# Patient Record
Sex: Male | Born: 1995 | Race: Black or African American | Hispanic: No | Marital: Single | Smoking: Never smoker
Health system: Southern US, Community
[De-identification: ages and names within clinical notes are randomized; demographics above are authoritative.]

---
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
## 2014-12-06 ENCOUNTER — Emergency Department (HOSPITAL_COMMUNITY): Payer: Medicaid Other

## 2014-12-06 ENCOUNTER — Emergency Department (HOSPITAL_COMMUNITY)
Admission: EM | Admit: 2014-12-06 | Discharge: 2014-12-07 | Disposition: A | Discharge status: Home / Self Care | Payer: Medicaid Other | Attending: Emergency Medicine | Admitting: Emergency Medicine

## 2014-12-06 ENCOUNTER — Encounter (HOSPITAL_COMMUNITY): Payer: Self-pay | Admitting: Emergency Medicine

## 2014-12-06 DIAGNOSIS — Y998 Other external cause status: Secondary | ICD-10-CM | POA: Insufficient documentation

## 2014-12-06 DIAGNOSIS — Z79899 Other long term (current) drug therapy: Secondary | ICD-10-CM | POA: Insufficient documentation

## 2014-12-06 DIAGNOSIS — S99911A Unspecified injury of right ankle, initial encounter: Secondary | ICD-10-CM | POA: Insufficient documentation

## 2014-12-06 DIAGNOSIS — Y9367 Activity, basketball: Secondary | ICD-10-CM | POA: Insufficient documentation

## 2014-12-06 DIAGNOSIS — Z792 Long term (current) use of antibiotics: Secondary | ICD-10-CM | POA: Insufficient documentation

## 2014-12-06 DIAGNOSIS — W231XXA Caught, crushed, jammed, or pinched between stationary objects, initial encounter: Secondary | ICD-10-CM | POA: Insufficient documentation

## 2014-12-06 DIAGNOSIS — R52 Pain, unspecified: Secondary | ICD-10-CM

## 2014-12-06 DIAGNOSIS — J45909 Unspecified asthma, uncomplicated: Secondary | ICD-10-CM | POA: Insufficient documentation

## 2014-12-06 DIAGNOSIS — M25571 Pain in right ankle and joints of right foot: Secondary | ICD-10-CM

## 2014-12-06 DIAGNOSIS — Y9289 Other specified places as the place of occurrence of the external cause: Secondary | ICD-10-CM | POA: Insufficient documentation

## 2014-12-06 DIAGNOSIS — T1490XA Injury, unspecified, initial encounter: Secondary | ICD-10-CM

## 2014-12-06 NOTE — ED Notes (Signed)
Pt. tripped and twisted his right ankle this evening, presents with pain and swelling at right ankle.

## 2014-12-06 NOTE — ED Provider Notes (Signed)
CSN: [id number]     Arrival date & time 12/06/14  2242 History  This chart was scribed for non-physician practitioner working, Trixie DredgeEmily Jaydis Duchene, PA-C, with Doug SouSam Jacubowitz, MD, by Bronson CurbJacqueline Melvin, ED Scribe. This patient was seen in room TR05C/TR05C and the patient's care was started at 11:54 PM.   Chief Complaint  Patient presents with  . Ankle Pain    The history is provided by the patient. No language interpreter was used.     HPI Comments: Aaron Dougherty is a 18 y.o. male who presents to the Emergency Department complaining of sudden onset, constant, right ankle pain that started PTA. Patient states he was playing basketball when his ankle got caught in the steps of the bleachers. There is associated swelling. He denies any other injuries, fall, head impact, LOC, numbness/weakness.   Past Medical History  Diagnosis Date  . Asthma    History reviewed. No pertinent past surgical history. No family history on file. History  Substance Use Topics  . Smoking status: Never Smoker   . Smokeless tobacco: Not on file  . Alcohol Use: No    Review of Systems  Constitutional: Negative for fever.  Cardiovascular: Negative for chest pain.  Gastrointestinal: Negative for abdominal pain.  Musculoskeletal: Positive for joint swelling (left ankle), arthralgias (left ankle) and gait problem.  Skin: Negative for color change and wound.  Allergic/Immunologic: Negative for immunocompromised state.  Neurological: Negative for weakness and numbness.  Hematological: Does not bruise/bleed easily.      Allergies  Review of patient's allergies indicates no known allergies.  Home Medications   Prior to Admission medications   Medication Sig Start Date End Date Taking? Authorizing Provider  albuterol (PROVENTIL HFA;VENTOLIN HFA) 108 (90 BASE) MCG/ACT inhaler Inhale 2 puffs into the lungs every 6 (six) hours as needed. Wheezing or shortness of breath    Historical Provider, MD  clindamycin (CLEOCIN) 300  MG capsule Take 2 capsules (600 mg total) by mouth 3 (three) times daily. 09/02/12   Twana FirstBryan R Hess, DO  ibuprofen (ADVIL,MOTRIN) 600 MG tablet Take 600 mg by mouth every 6 (six) hours as needed.    Historical Provider, MD   Triage Vitals: BP 132/58 mmHg  Pulse 95  Temp(Src) 98 F (36.7 C) (Oral)  Resp 18  Ht 6\' 1"  (1.854 m)  Wt 185 lb (83.915 kg)  BMI 24.41 kg/m2  SpO2 100%  Physical Exam  Constitutional: He appears well-developed and well-nourished. No distress.  HENT:  Head: Normocephalic and atraumatic.  Neck: Neck supple.  Pulmonary/Chest: Effort normal.  Musculoskeletal:       Right ankle: He exhibits swelling. Tenderness.       Right lower leg: Normal.       Right foot: Normal. There is no bony tenderness, normal capillary refill, no deformity and no laceration.       Feet:  Right foot: Dorsalis pedis pulses intact, capillary refill < 2 seconds, sensation intact.   Neurological: He is alert.  Skin: He is not diaphoretic.  Nursing note and vitals reviewed.   ED Course  Procedures (including critical care time)  DIAGNOSTIC STUDIES: Oxygen Saturation is 100% on room air, normal by my interpretation.    COORDINATION OF CARE: At 2356 Discussed treatment plan with patient. Patient agrees.   Labs Review Labs Reviewed - No data to display  Imaging Review Dg Ankle Complete Right  12/06/2014   CLINICAL DATA:  18 year old male with history of trauma earlier today while playing basketball with a rolling  injury to the right ankle complaining of pain and swelling.  EXAM: RIGHT ANKLE - COMPLETE 3+ VIEW  COMPARISON:  02/11/2011.  FINDINGS: Soft tissue swelling overlying the lateral malleolus, and anterior to the tibiotalar joint. No acute displaced fracture, subluxation or dislocation.  IMPRESSION: 1. Negative for acute bony trauma. 2. Extensive soft tissue swelling of the lateral malleolus and anterior to the tibiotalar joint.   Electronically Signed   By: Trudie Reedaniel  Entrikin M.D.    On: 12/06/2014 23:30     EKG Interpretation None      MDM   Final diagnoses:  Right ankle pain    Afebrile, nontoxic patient with right ankle pain and swelling.  Likely contusion and possible sprain.  Xray negative.   D/C home with aso, crutches, ibuprofen, advised RICE treatment.  Discussed result, findings, treatment, and follow up  with patient.  Pt given return precautions.  Pt verbalizes understanding and agrees with plan.       I personally performed the services described in this documentation, which was scribed in my presence. The recorded information has been reviewed and is accurate.    Trixie Dredgemily Belvia Gotschall, PA-C 12/07/14 0009  Doug SouSam Jacubowitz, MD 12/07/14 [id number]

## 2014-12-06 NOTE — ED Notes (Signed)
Pt present to ED for eval of swelling noted to lateral aspect of left ankle, states got ankle caught in a bleacher step. NAD, pulses present, limited ROM noted due to pain. Denies any numbness.

## 2014-12-06 NOTE — ED Notes (Signed)
Ice applied to ankle

## 2014-12-07 MED ORDER — IBUPROFEN 800 MG PO TABS: 800.0000 mg | ORAL_TABLET | Freq: Three times a day (TID) | ORAL | Status: AC | PRN

## 2014-12-07 NOTE — Discharge Instructions (Signed)
Read the information below.  Use the prescribed medication as directed.  Please discuss all new medications with your pharmacist.  You may return to the Emergency Department at any time for worsening condition or any new symptoms that concern you.  If you develop uncontrolled pain, weakness or numbness of the extremity, severe discoloration of the skin, or you are unable to walk, return to the ER for a recheck.    ° ° °Ankle Pain °Ankle pain is a common symptom. The bones, cartilage, tendons, and muscles of the ankle joint perform a lot of work each day. The ankle joint holds your body weight and allows you to move around. Ankle pain can occur on either side or back of 1 or both ankles. Ankle pain may be sharp and burning or dull and aching. There may be tenderness, stiffness, redness, or warmth around the ankle. The pain occurs more often when a person walks or puts pressure on the ankle. °CAUSES  °There are many reasons ankle pain can develop. It is important to work with your caregiver to identify the cause since many conditions can impact the bones, cartilage, muscles, and tendons. Causes for ankle pain include: °· Injury, including a break (fracture), sprain, or strain often due to a fall, sports, or a high-impact activity. °· Swelling (inflammation) of a tendon (tendonitis). °· Achilles tendon rupture. °· Ankle instability after repeated sprains and strains. °· Poor foot alignment. °· Pressure on a nerve (tarsal tunnel syndrome). °· Arthritis in the ankle or the lining of the ankle. °· Crystal formation in the ankle (gout or pseudogout). °DIAGNOSIS  °A diagnosis is based on your medical history, your symptoms, results of your physical exam, and results of diagnostic tests. Diagnostic tests may include X-ray exams or a computerized magnetic scan (magnetic resonance imaging, MRI). °TREATMENT  °Treatment will depend on the cause of your ankle pain and may include: °· Keeping pressure off the ankle and limiting  activities. °· Using crutches or other walking support (a cane or brace). °· Using rest, ice, compression, and elevation. °· Participating in physical therapy or home exercises. °· Wearing shoe inserts or special shoes. °· Losing weight. °· Taking medications to reduce pain or swelling or receiving an injection. °· Undergoing surgery. °HOME CARE INSTRUCTIONS  °· Only take over-the-counter or prescription medicines for pain, discomfort, or fever as directed by your caregiver. °· Put ice on the injured area. °¨ Put ice in a plastic bag. °¨ Place a towel between your skin and the bag. °¨ Leave the ice on for 15-20 minutes at a time, 03-04 times a day. °· Keep your leg raised (elevated) when possible to lessen swelling. °· Avoid activities that cause ankle pain. °· Follow specific exercises as directed by your caregiver. °· Record how often you have ankle pain, the location of the pain, and what it feels like. This information may be helpful to you and your caregiver. °· Ask your caregiver about returning to work or sports and whether you should drive. °· Follow up with your caregiver for further examination, therapy, or testing as directed. °SEEK MEDICAL CARE IF:  °· Pain or swelling continues or worsens beyond 1 week. °· You have an oral temperature above 102° F (38.9° C). °· You are feeling unwell or have chills. °· You are having an increasingly difficult time with walking. °· You have loss of sensation or other new symptoms. °· You have questions or concerns. °MAKE SURE YOU:  °· Understand these instructions. °· Will   watch your condition. °· Will get help right away if you are not doing well or get worse. °Document Released: 05/15/2010 Document Revised: 02/17/2012 Document Reviewed: 05/15/2010 °[employer]® Patient Information ©2015 [employer]. This information is not intended to replace advice given to you by your health care provider. Make sure you discuss any questions you have with your health care  provider. ° °

## 2015-03-07 LAB — HM HIV SCREENING LAB: HM HIV Screening: NEGATIVE

## 2015-06-04 ENCOUNTER — Encounter (HOSPITAL_COMMUNITY): Payer: Self-pay | Admitting: *Deleted

## 2015-06-04 ENCOUNTER — Emergency Department (HOSPITAL_COMMUNITY)
Admission: EM | Admit: 2015-06-04 | Discharge: 2015-06-04 | Disposition: A | Discharge status: Home / Self Care | Payer: Medicaid Other | Attending: Emergency Medicine | Admitting: Emergency Medicine

## 2015-06-04 ENCOUNTER — Emergency Department (HOSPITAL_COMMUNITY): Payer: Medicaid Other

## 2015-06-04 DIAGNOSIS — Z792 Long term (current) use of antibiotics: Secondary | ICD-10-CM | POA: Insufficient documentation

## 2015-06-04 DIAGNOSIS — S62609A Fracture of unspecified phalanx of unspecified finger, initial encounter for closed fracture: Secondary | ICD-10-CM

## 2015-06-04 DIAGNOSIS — S61214A Laceration without foreign body of right ring finger without damage to nail, initial encounter: Secondary | ICD-10-CM | POA: Insufficient documentation

## 2015-06-04 DIAGNOSIS — S62664A Nondisplaced fracture of distal phalanx of right ring finger, initial encounter for closed fracture: Secondary | ICD-10-CM | POA: Insufficient documentation

## 2015-06-04 DIAGNOSIS — Z79899 Other long term (current) drug therapy: Secondary | ICD-10-CM | POA: Insufficient documentation

## 2015-06-04 DIAGNOSIS — Y9389 Activity, other specified: Secondary | ICD-10-CM | POA: Insufficient documentation

## 2015-06-04 DIAGNOSIS — S61219A Laceration without foreign body of unspecified finger without damage to nail, initial encounter: Secondary | ICD-10-CM

## 2015-06-04 DIAGNOSIS — Y9289 Other specified places as the place of occurrence of the external cause: Secondary | ICD-10-CM | POA: Insufficient documentation

## 2015-06-04 DIAGNOSIS — W208XXA Other cause of strike by thrown, projected or falling object, initial encounter: Secondary | ICD-10-CM | POA: Insufficient documentation

## 2015-06-04 DIAGNOSIS — J45909 Unspecified asthma, uncomplicated: Secondary | ICD-10-CM | POA: Insufficient documentation

## 2015-06-04 DIAGNOSIS — Y998 Other external cause status: Secondary | ICD-10-CM | POA: Insufficient documentation

## 2015-06-04 MED ORDER — CEPHALEXIN 500 MG PO CAPS: 500.0000 mg | ORAL_CAPSULE | Freq: Four times a day (QID) | ORAL | Status: DC

## 2015-06-04 MED ORDER — LIDOCAINE HCL (PF) 1 % IJ SOLN
INTRAMUSCULAR | Status: AC
  Administered 2015-06-04: 5 mL via INTRADERMAL
  Filled 2015-06-04: qty 5

## 2015-06-04 MED ORDER — LIDOCAINE HCL (PF) 1 % IJ SOLN
5.0000 mL | Freq: Once | INTRAMUSCULAR | Status: AC
  Administered 2015-06-04: 5 mL via INTRADERMAL
  Filled 2015-06-04: qty 5

## 2015-06-04 NOTE — Discharge Instructions (Signed)
Finger Fracture A finger fracture is when one or more bones in the finger break.  HOME CARE   Wear the splint, tape, or cast as long as told by your doctor.  Keep your fingers in the position your doctor tell you to.  Raise (elevate) the injured area above the level of the heart.  Only take medicine as told by your doctor.  Put ice on the injured area.  Put ice in a plastic bag.  Place a towel between the skin and the bag.  Leave the ice on for 15-20 minutes, 03-04 times a day.  Follow up with your doctor.  Ask what exercises you can do when the splint comes off. GET HELP RIGHT AWAY IF:   The fingernails are white or bluish.  You have pain not helped by medicine.  You cannot move your fingertips.  You lose feeling (numbness) in the injured finger(s). MAKE SURE YOU:   Understand these instructions.  Will watch this condition.  Will get help right away if you are not doing well or get worse. Document Released: 05/13/2008 Document Revised: 02/17/2012 Document Reviewed: 05/13/2008 [facility] Patient Information 2015 [place], [place]. This information is not intended to replace advice given to you by your health care provider. Make sure you discuss any questions you have with your health care provider.  Laceration Care, Adult A laceration is a cut that goes through all layers of the skin. The cut goes into the tissue beneath the skin. HOME CARE For stitches (sutures) or staples:  Keep the cut clean and dry.  If you have a bandage (dressing), change it at least once a day. Change the bandage if it gets wet or dirty, or as told by your doctor.  Wash the cut with soap and water 2 times a day. Rinse the cut with water. Pat it dry with a clean towel.  Put a thin layer of medicated cream on the cut as told by your doctor.  You may shower after the first 24 hours. Do not soak the cut in water until the stitches are removed.  Only take medicines as told by your  doctor.  Have your stitches or staples removed as told by your doctor. For skin adhesive strips:  Keep the cut clean and dry.  Do not get the strips wet. You may take a bath, but be careful to keep the cut dry.  If the cut gets wet, pat it dry with a clean towel.  The strips will fall off on their own. Do not remove the strips that are still stuck to the cut. For wound glue:  You may shower or take baths. Do not soak or scrub the cut. Do not swim. Avoid heavy sweating until the glue falls off on its own. After a shower or bath, pat the cut dry with a clean towel.  Do not put medicine on your cut until the glue falls off.  If you have a bandage, do not put tape over the glue.  Avoid lots of sunlight or tanning lamps until the glue falls off. Put sunscreen on the cut for the first year to reduce your scar.  The glue will fall off on its own. Do not pick at the glue. You may need a tetanus shot if:  You cannot remember when you had your last tetanus shot.  You have never had a tetanus shot. If you need a tetanus shot and you choose not to have one, you may get tetanus. Sickness  from tetanus can be serious. GET HELP RIGHT AWAY IF:   Your pain does not get better with medicine.  Your arm, hand, leg, or foot loses feeling (numbness) or changes color.  Your cut is bleeding.  Your joint feels weak, or you cannot use your joint.  You have painful lumps on your body.  Your cut is red, puffy (swollen), or painful.  You have a red line on the skin near the cut.  You have yellowish-white fluid (pus) coming from the cut.  You have a fever.  You have a bad smell coming from the cut or bandage.  Your cut breaks open before or after stitches are removed.  You notice something coming out of the cut, such as wood or glass.  You cannot move a finger or toe. MAKE SURE YOU:   Understand these instructions.  Will watch your condition.  Will get help right away if you are not  doing well or get worse. Document Released: 05/13/2008 Document Revised: 02/17/2012 Document Reviewed: 05/21/2011 [facility] Patient Information 2015 [place], [place]. This information is not intended to replace advice given to you by your health care provider. Make sure you discuss any questions you have with your health care provider.

## 2015-06-04 NOTE — ED Notes (Signed)
Pt states that he was working with his father today when he dropped a tool on his right 4th finger, laceration noted to knuckle area of finger, bleeding controlled,

## 2015-06-06 NOTE — ED Provider Notes (Signed)
CSN: [id number]     Arrival date & time 06/04/15  2038 History   First MD Initiated Contact with Patient 06/04/15 2053     Chief Complaint  Patient presents with  . Finger Injury     (Consider location/radiation/quality/duration/timing/severity/associated sxs/prior Treatment) HPI  Aaron Dougherty is a 19 y.o. male who presents to the Emergency Department complaining of pain and laceration to his right ring finger.  Reports a direct blow to the finger from a metal tool that was accidentally dropped.  Pain located at the PIP of the finger.  He reports mild bleeding that resolved after applying pressure.  He denies numbness, swelling or injuries.  Last TD is < 5 years ago.  Pain is worse with bending the finger.      Past Medical History  Diagnosis Date  . Asthma    History reviewed. No pertinent past surgical history. No family history on file. History  Substance Use Topics  . Smoking status: Never Smoker   . Smokeless tobacco: Not on file  . Alcohol Use: No    Review of Systems  Constitutional: Negative for fever and chills.  Musculoskeletal: Positive for arthralgias. Negative for joint swelling.       Pain to right ring finger  Skin: Positive for wound. Negative for color change.  All other systems reviewed and are negative.     Allergies  Review of patient's allergies indicates no known allergies.  Home Medications   Prior to Admission medications   Medication Sig Start Date End Date Taking? Authorizing Provider  albuterol (PROVENTIL HFA;VENTOLIN HFA) 108 (90 BASE) MCG/ACT inhaler Inhale 2 puffs into the lungs every 6 (six) hours as needed. Wheezing or shortness of breath    Historical Provider, MD  cephALEXin (KEFLEX) 500 MG capsule Take 1 capsule (500 mg total) by mouth 4 (four) times daily. For 7 days 06/04/15   Raenah Murley, PA-C  clindamycin (CLEOCIN) 300 MG capsule Take 2 capsules (600 mg total) by mouth 3 (three) times daily. 09/02/12   [name], DO  ibuprofen  (ADVIL,MOTRIN) 800 MG tablet Take 1 tablet (800 mg total) by mouth every 8 (eight) hours as needed for mild pain or moderate pain. 12/07/14   Trixie Dredge, PA-C   BP 125/63 mmHg  Pulse 87  Temp(Src) 97.3 F (36.3 C) (Oral)  Resp 24  Ht  (1.854 m)  Wt 185 lb (83.915 kg)  BMI 24.41 kg/m2  SpO2 99% Physical Exam  Constitutional: He appears well-developed and well-nourished. No distress.  HENT:  Head: Normocephalic and atraumatic.  Cardiovascular: Normal rate, regular rhythm and intact distal pulses.   Pulmonary/Chest: Effort normal. No respiratory distress.  Musculoskeletal: Normal range of motion. He exhibits tenderness. He exhibits no edema.       Right hand: He exhibits laceration. He exhibits normal capillary refill, no deformity and no swelling. Normal strength noted. He exhibits no finger abduction.       Hands: Neurological: He is alert.  Skin: Skin is warm and dry.  Laceration to dorsal aspect of the right third finger at the PIP joint.  Bleeding controlled.  No edema.  No obvious injury to deep structures of the finger.   Nursing note and vitals reviewed.   ED Course  Procedures (including critical care time) Labs Review Labs Reviewed - No data to display  Imaging Review Dg Finger Ring Right  06/04/2015   CLINICAL DATA:  Pain, laceration fourth finger on the right, dropped a tool on the  finger today with laceration to knuckle  EXAM: RIGHT RING FINGER 2+V  COMPARISON:  None.  FINDINGS: Oblique nondisplaced fracture through the radial side base of the distal phalanx extending into the distal interphalangeal joint.  IMPRESSION: Fracture   Electronically Signed   By: Esperanza Heiraymond  Rubner M.D.   On: 06/04/2015 21:37     EKG Interpretation None       LACERATION REPAIR Performed by: Marquette Piontek L. Authorized by: Maxwell CaulRIPLETT,Aunya Lemler L. Consent: Verbal consent obtained. Risks and benefits: risks, benefits and alternatives were discussed Consent given by: patient Patient  identity confirmed: provided demographic data Prepped and Draped in normal sterile fashion Wound explored  Laceration Location: dorsal right ring finger  Laceration Length: 2 cm  No Foreign Bodies seen or palpated  Anesthesia: local infiltration  Local anesthetic: lidocaine 1% w/o epinephrine  Anesthetic total: 2 ml  Irrigation method: syringe Amount of cleaning: standard  Skin closure: 4-0 prolene  Number of sutures: 5  Technique: simple interrupted  Patient tolerance: Patient tolerated the procedure well with no immediate complications.  MDM   Final diagnoses:  Finger fracture, closed, initial encounter  Finger laceration, initial encounter    XR reviewed and discussed with pt.  No tenderness or wound at the DIP.  NO fx at the PIP.  Pt has full ROM of the finger.  NV intact.    Finger bandaged and splinted.  He agrees to proper wound care, Keflex and close f/u with ortho.  Sutures out in 10 days.  Advised to return if any signs of infection  Pauline Ausammy Reighlynn Swiney, PA-C 06/06/15 1852  Rolland PorterMark James, MD 06/12/15 2039

## 2015-10-16 ENCOUNTER — Emergency Department (HOSPITAL_COMMUNITY)
Admission: EM | Admit: 2015-10-16 | Discharge: 2015-10-16 | Disposition: A | Discharge status: Home / Self Care | Payer: Medicaid Other | Attending: Emergency Medicine | Admitting: Emergency Medicine

## 2015-10-16 ENCOUNTER — Encounter (HOSPITAL_COMMUNITY): Payer: Self-pay | Admitting: *Deleted

## 2015-10-16 DIAGNOSIS — L02411 Cutaneous abscess of right axilla: Secondary | ICD-10-CM | POA: Insufficient documentation

## 2015-10-16 DIAGNOSIS — J45909 Unspecified asthma, uncomplicated: Secondary | ICD-10-CM | POA: Insufficient documentation

## 2015-10-16 DIAGNOSIS — L02213 Cutaneous abscess of chest wall: Secondary | ICD-10-CM | POA: Insufficient documentation

## 2015-10-16 DIAGNOSIS — L0291 Cutaneous abscess, unspecified: Secondary | ICD-10-CM

## 2015-10-16 DIAGNOSIS — Z792 Long term (current) use of antibiotics: Secondary | ICD-10-CM | POA: Insufficient documentation

## 2015-10-16 MED ORDER — CEPHALEXIN 500 MG PO CAPS: 500.0000 mg | ORAL_CAPSULE | Freq: Four times a day (QID) | ORAL | Status: AC

## 2015-10-16 MED ORDER — CEPHALEXIN 250 MG PO CAPS
500.0000 mg | ORAL_CAPSULE | Freq: Once | ORAL | Status: AC
  Administered 2015-10-16: 500 mg via ORAL
  Filled 2015-10-16: qty 2

## 2015-10-16 MED ORDER — LIDOCAINE-EPINEPHRINE (PF) 2 %-1:200000 IJ SOLN
10.0000 mL | Freq: Once | INTRAMUSCULAR | Status: AC
  Administered 2015-10-16: 10 mL via INTRADERMAL
  Filled 2015-10-16: qty 20

## 2015-10-16 NOTE — Discharge Instructions (Signed)
°  Abscess Take antibiotics as prescribed. Return for fever, increased swelling. An abscess (boil or furuncle) is an infected area on or under the skin. This area is filled with yellowish-white fluid (pus) and other material (debris). HOME CARE   Only take medicines as told by your doctor.  If you were given antibiotic medicine, take it as directed. Finish the medicine even if you start to feel better.  If gauze is used, follow your doctor's directions for changing the gauze.  To avoid spreading the infection:  Keep your abscess covered with a bandage.  Wash your hands well.  Do not share personal care items, towels, or whirlpools with others.  Avoid skin contact with others.  Keep your skin and clothes clean around the abscess.  Keep all doctor visits as told. GET HELP RIGHT AWAY IF:   You have more pain, puffiness (swelling), or redness in the wound site.  You have more fluid or blood coming from the wound site.  You have muscle aches, chills, or you feel sick.  You have a fever. MAKE SURE YOU:   Understand these instructions.  Will watch your condition.  Will get help right away if you are not doing well or get worse.   This information is not intended to replace advice given to you by your health care provider. Make sure you discuss any questions you have with your health care provider.   Document Released: 05/13/2008 Document Revised: 05/26/2012 Document Reviewed: 02/08/2012 Elsevier Interactive Patient Education [employer]2016 [employer]

## 2015-10-16 NOTE — ED Notes (Signed)
Declined W/C at D/C and was escorted to lobby by RN. 

## 2015-10-16 NOTE — ED Notes (Signed)
Pt reports having two abscess that are causing pain, one is under right arm x 3-4 days and one on chest x 1 year.

## 2015-10-16 NOTE — ED Provider Notes (Signed)
CSN: [id number]     Arrival date & time 10/16/15  8657 History  By signing my name below, I, [name], attest that this documentation has been prepared under the direction and in the presence of Catha Gosselin, PA-C Electronically Signed: Charline Bills, ED Scribe 10/17/2015 at 10:37 AM.   Chief Complaint  Patient presents with  . Abscess   The history is provided by the patient. No language interpreter was used.   HPI Comments: Aaron Dougherty is a 19 y.o. male who presents to the Emergency Department complaining of a gradually worsening, tender abscess to the right axillary area, first noticed 3 days ago. Pt reports increased pain with palpation. He also presents with a recurrent abscess to the left chest for the past 3 years. Pt states that his mother popped the abscess to his chest in the past, but it returned. No medications tried PTA. Pt denies fever. He has had a knee abscess lanced in the past without any complications. No known medical allergies.   Past Medical History  Diagnosis Date  . Asthma    History reviewed. No pertinent past surgical history. History reviewed. No pertinent family history. Social History  Substance Use Topics  . Smoking status: Never Smoker   . Smokeless tobacco: None  . Alcohol Use: No    Review of Systems  Constitutional: Negative for fever.  Skin:       +Abscess   Allergies  Review of patient's allergies indicates no known allergies.  Home Medications   Prior to Admission medications   Medication Sig Start Date End Date Taking? Authorizing Provider  albuterol (PROVENTIL HFA;VENTOLIN HFA) 108 (90 BASE) MCG/ACT inhaler Inhale 2 puffs into the lungs every 6 (six) hours as needed. Wheezing or shortness of breath    Historical Provider, MD  cephALEXin (KEFLEX) 500 MG capsule Take 1 capsule (500 mg total) by mouth 4 (four) times daily. 10/16/15   Lynisha Osuch Patel-Mills, PA-C  clindamycin (CLEOCIN) 300 MG capsule Take 2 capsules (600 mg total) by  mouth 3 (three) times daily. 09/02/12   [name], DO  ibuprofen (ADVIL,MOTRIN) 800 MG tablet Take 1 tablet (800 mg total) by mouth every 8 (eight) hours as needed for mild pain or moderate pain. 12/07/14   Trixie Dredge, PA-C   BP 150/83 mmHg  Pulse 69  Temp(Src) 97.6 F (36.4 C) (Oral)  Resp 20  SpO2 100% Physical Exam  Constitutional: He is oriented to person, place, and time. He appears well-developed and well-nourished. No distress.  HENT:  Head: Normocephalic and atraumatic.  Eyes: Conjunctivae and EOM are normal.  Neck: Neck supple. No tracheal deviation present.  Cardiovascular: Normal rate.   Pulmonary/Chest: Effort normal. No respiratory distress.  Musculoskeletal: Normal range of motion.  Neurological: He is alert and oriented to person, place, and time.  Skin: Skin is warm and dry.  R axillary: Partially fluctuant and indurated, tender area measuring 5 x 4 cm but no active drainage or surrounding erythema L upper chest: indurated abscess without drainage measuring approximately 2 x 2 centimeters without surrounding erythema or drainage.   Psychiatric: He has a normal mood and affect. His behavior is normal.  Nursing note and vitals reviewed.  ED Course  Procedures (including critical care time) DIAGNOSTIC STUDIES: Oxygen Saturation is 100% on RA, normal by my interpretation.    COORDINATION OF CARE: 9:34 AM-Discussed treatment plan which includes I&D with pt at bedside and pt agreed to plan.   INCISION AND DRAINAGE PROCEDURE NOTE: Patient  identification was confirmed and verbal consent was obtained. This procedure was performed by Catha GosselinHanna Patel-Mills, PA-C at 10:30 AM. Site: R axilla Sterile procedures observed Needle size: 25 gauge Anesthetic used (type and amt): lidocaine with 2% epinephrine Blade size: 11 Drainage: none Complexity: Complex Site anesthetized, incision made over site, wound drained and explored loculations, rinsed with copious amounts of normal  saline, covered with dry, sterile dressing.  Pt tolerated procedure well without complications.  Instructions for care discussed verbally and pt provided with additional written instructions for homecare and f/u. The wound could not be packed due to induration.  Labs Review Labs Reviewed - No data to display  Imaging Review No results found.   EKG Interpretation None      MDM   Final diagnoses:  Abscess  Patient presents with 2 abscesses, one in the right axilla and one on the left side of his chest. The one in the axilla was lanced but there was no drainage. The one on the chest was indurated and did not require lancing. The patient was put on antibiotics. I discussed return precautions with the patient as well as follow-up and he verbally agrees with the plan. Medications  lidocaine-EPINEPHrine (XYLOCAINE W/EPI) 2 %-1:200000 (PF) injection 10 mL (10 mLs Intradermal Given 10/16/15 0947)  cephALEXin (KEFLEX) capsule 500 mg (500 mg Oral Given 10/16/15 1044)   I personally performed the services described in this documentation, which was scribed in my presence. The recorded information has been reviewed and is accurate.    Catha GosselinHanna Patel-Mills, PA-C 10/17/15 [phone]  Laurence Spatesachel Morgan Little, MD 10/18/15 1004

## 2016-02-29 ENCOUNTER — Emergency Department (HOSPITAL_COMMUNITY)
Admission: EM | Admit: 2016-02-29 | Discharge: 2016-02-29 | Disposition: A | Discharge status: Home / Self Care | Payer: Medicaid Other | Attending: Emergency Medicine | Admitting: Emergency Medicine

## 2016-02-29 ENCOUNTER — Encounter (HOSPITAL_COMMUNITY): Payer: Self-pay | Admitting: *Deleted

## 2016-02-29 DIAGNOSIS — N342 Other urethritis: Secondary | ICD-10-CM | POA: Insufficient documentation

## 2016-02-29 DIAGNOSIS — L089 Local infection of the skin and subcutaneous tissue, unspecified: Secondary | ICD-10-CM

## 2016-02-29 DIAGNOSIS — Z792 Long term (current) use of antibiotics: Secondary | ICD-10-CM | POA: Insufficient documentation

## 2016-02-29 DIAGNOSIS — J45909 Unspecified asthma, uncomplicated: Secondary | ICD-10-CM | POA: Insufficient documentation

## 2016-02-29 DIAGNOSIS — L723 Sebaceous cyst: Secondary | ICD-10-CM | POA: Insufficient documentation

## 2016-02-29 DIAGNOSIS — Z79899 Other long term (current) drug therapy: Secondary | ICD-10-CM | POA: Insufficient documentation

## 2016-02-29 MED ORDER — LIDOCAINE-EPINEPHRINE (PF) 2 %-1:200000 IJ SOLN
10.0000 mL | Freq: Once | INTRAMUSCULAR | Status: AC
  Administered 2016-02-29: 10 mL
  Filled 2016-02-29: qty 20

## 2016-02-29 MED ORDER — STERILE WATER FOR INJECTION IJ SOLN
INTRAMUSCULAR | Status: AC
  Filled 2016-02-29: qty 10

## 2016-02-29 MED ORDER — CEFTRIAXONE SODIUM 250 MG IJ SOLR
250.0000 mg | Freq: Once | INTRAMUSCULAR | Status: AC
  Administered 2016-02-29: 250 mg via INTRAMUSCULAR
  Filled 2016-02-29: qty 250

## 2016-02-29 MED ORDER — AZITHROMYCIN 250 MG PO TABS
1000.0000 mg | ORAL_TABLET | Freq: Once | ORAL | Status: AC
  Administered 2016-02-29: 1000 mg via ORAL
  Filled 2016-02-29: qty 4

## 2016-02-29 NOTE — ED Provider Notes (Signed)
CSN: [id number]     Arrival date & time 02/29/16  1140 History  By signing my name below, I, Ronney LionSuzanne Le, attest that this documentation has been prepared under the direction and in the presence of Elpidio AnisShari Haden Cavenaugh, PA-C. Electronically Signed: Ronney LionSuzanne Le, ED Scribe. 02/29/2016. 2:19 PM.    Chief Complaint  Patient presents with  . Wound Check  . Exposure to STD   The history is provided by the patient. No language interpreter was used.    HPI Comments: Aaron Dougherty is a 20 y.o. male with a history of asthma, who presents to the Emergency Department complaining of a recurrent, constant, worsening, localized area of pain and swelling on his upper chest that has been present for several months. Patient states he has had recurrent abscesses in the same area in the past since he was a young child, which he has had I&D'ed in the past.  Patient also complains of constant, unchanged, penile discharge and burning dysuria that began last week. He states his sexual partner was recently tested positive for chlamydia. He denies fever or testicular pain.   Past Medical History  Diagnosis Date  . Asthma    History reviewed. No pertinent past surgical history. No family history on file. Social History  Substance Use Topics  . Smoking status: Never Smoker   . Smokeless tobacco: None  . Alcohol Use: No    Review of Systems  Constitutional: Negative for fever.  Gastrointestinal: Negative for nausea, vomiting and abdominal pain.  Genitourinary: Positive for dysuria and discharge. Negative for scrotal swelling and testicular pain.  Skin:       Positive for localized area of pain and swelling on upper chest.    Allergies  Review of patient's allergies indicates no known allergies.  Home Medications   Prior to Admission medications   Medication Sig Start Date End Date Taking? Authorizing Provider  albuterol (PROVENTIL HFA;VENTOLIN HFA) 108 (90 BASE) MCG/ACT inhaler Inhale 2 puffs into the lungs every  6 (six) hours as needed. Wheezing or shortness of breath    Historical Provider, MD  cephALEXin (KEFLEX) 500 MG capsule Take 1 capsule (500 mg total) by mouth 4 (four) times daily. 10/16/15   Hanna Patel-Mills, PA-C  clindamycin (CLEOCIN) 300 MG capsule Take 2 capsules (600 mg total) by mouth 3 (three) times daily. 09/02/12   Twana FirstBryan R Hess, DO  ibuprofen (ADVIL,MOTRIN) 800 MG tablet Take 1 tablet (800 mg total) by mouth every 8 (eight) hours as needed for mild pain or moderate pain. 12/07/14   Trixie DredgeEmily West, PA-C   BP 132/72 mmHg  Pulse 70  Temp(Src) 97.7 F (36.5 C) (Oral)  Resp 20  Ht 6\' 1"  (1.854 m)  Wt 185 lb (83.915 kg)  BMI 24.41 kg/m2  SpO2 98% Physical Exam  Constitutional: He is oriented to person, place, and time. He appears well-developed and well-nourished. No distress.  HENT:  Head: Normocephalic and atraumatic.  Eyes: Conjunctivae and EOM are normal.  Neck: Neck supple. No tracheal deviation present.  Cardiovascular: Normal rate.   Pulmonary/Chest: Effort normal. No respiratory distress.  Genitourinary:  No visualized penile discharge. Circumcised. No rash. No testicular tenderness or scrotal swelling.   Musculoskeletal: Normal range of motion.  Lymphadenopathy:       Right: No inguinal adenopathy present.       Left: No inguinal adenopathy present.  Neurological: He is alert and oriented to person, place, and time.  Skin: Skin is warm and dry.  Large 3 cm  x 4 cm firm swelling to central upper chest with fluctuant area at upper border. No active drainage. No redness. Mildly tender.  Psychiatric: He has a normal mood and affect. His behavior is normal.  Nursing note and vitals reviewed.   ED Course  Procedures (including critical care time)  DIAGNOSTIC STUDIES: Oxygen Saturation is 98% on RA, normal by my interpretation.    COORDINATION OF CARE: 1:51 PM - Discussed treatment plan with pt at bedside which includes I&D of area on chest. Pt verbalized understanding and  agreed to plan.   INCISION AND DRAINAGE PROCEDURE NOTE: Patient identification was confirmed and verbal consent was obtained. This procedure was performed by Elpidio Anis, PA-C, at 2:01 PM. Site: Upper chest Sterile procedures observed Needle size: 25 Anesthetic used (type and amt): lidocaine 2% w/ epi, 2 mL  Blade size: 11 Drainage: Copious purulent with small amount sebaceous material Complexity: Complex Site anesthetized, incision made over site, wound drained and explored loculations, rinsed with copious amounts of normal saline, covered  with dry, sterile dressing.  Pt tolerated procedure well without complications.   Instructions for care discussed verbally and pt provided with additional written instructions for homecare and f/u.   MDM   Final diagnoses:  None  1. Chest wall abscess 2. Infected sebaceous cyst  Patient with skin abscess amenable to incision and drainage.  Encouraged home warm soaks and flushing. Will d/c to home.  No antibiotic therapy is indicated. He is also treated for STD exposure with Zithromax and rocephin.   I personally performed the services described in this documentation, which was scribed in my presence. The recorded information has been reviewed and is accurate.       Elpidio Anis, PA-C 02/29/16 1457  Pricilla Loveless, MD 03/03/16 [phone]

## 2016-02-29 NOTE — ED Notes (Signed)
Pt reports boil to chest. Pt reports that it has been there for several months. Pt also reports std exposure.

## 2016-02-29 NOTE — Discharge Instructions (Signed)
Sebaceous Cyst Removal Sebaceous cyst removal is a procedure to remove a sac of oily material that forms under your skin (sebaceous cyst). Sebaceous cysts may also be called epidermoid cysts or keratin cysts. Normally, the skin secretes this oily material through a gland or a hair follicle. This type of cyst usually results when a skin gland or hair follicle becomes blocked. You may need this procedure if you have a sebaceous cyst that becomes large, uncomfortable, or infected. LET [facility] CARE PROVIDER KNOW ABOUT:  Any allergies you have.  All medicines you are taking, including vitamins, herbs, eye drops, creams, and over-the-counter medicines.  Previous problems you or members of your family have had with the use of anesthetics.  Any blood disorders you have.  Previous surgeries you have had.  Medical conditions you have. RISKS AND COMPLICATIONS Generally, this is a safe procedure. However, problems may occur, including:  Developing another cyst.  Bleeding.  Infection.  Scarring. BEFORE THE PROCEDURE  Ask your health care provider about:  Changing or stopping your regular medicines. This is especially important if you are taking diabetes medicines or blood thinners.  Taking medicines such as aspirin and ibuprofen. These medicines can thin your blood. Do not take these medicines before your procedure if your health care provider instructs you not to.  If you have an infected cyst, you may have to take antibiotic medicines before or after the cyst removal. Take your antibiotics as directed by your health care provider. Finish all of the medicine even if you start to feel better.  Take a shower on the morning of your procedure. Your health care provider may ask you to use a germ-killing (antiseptic) soap. PROCEDURE  You will be given a medicine that numbs the area (local anesthetic).  The skin around the cyst will be cleaned with a germ-killing solution  (antiseptic).  Your health care provider will make a small surgical incision over the cyst.  The cyst will be separated from the surrounding tissues that are under your skin.  If possible, the cyst will be removed undamaged (intact).  If the cyst bursts (ruptures), it will need to be removed in pieces.  After the cyst is removed, your health care provider will control any bleeding and close the incision with small stitches (sutures). Small incisions may not need sutures, and the bleeding will be controlled by applying direct pressure with gauze.  Your health care provider may apply antibiotic ointment and a light bandage (dressing) over the incision. This procedure may vary among health care providers and hospitals. AFTER THE PROCEDURE  If your cyst ruptured during surgery, you may need to take antibiotic medicine. If you were prescribed an antibiotic medicine, finish all of it even if you start to feel better.   This information is not intended to replace advice given to you by your health care provider. Make sure you discuss any questions you have with your health care provider.   Document Released: 11/22/2000 Document Revised: 12/16/2014 Document Reviewed: 08/10/2014 Elsevier Interactive Patient Education 2016 [employer]. Sexually Transmitted Disease A sexually transmitted disease (STD) is a disease or infection that may be passed (transmitted) from person to person, usually during sexual activity. This may happen by way of saliva, semen, blood, vaginal mucus, or urine. Common STDs include:  Gonorrhea.  Chlamydia.  Syphilis.  HIV and AIDS.  Genital herpes.  Hepatitis B and C.  Trichomonas.  Human papillomavirus (HPV).  Pubic lice.  Scabies.  Mites.  Bacterial vaginosis. WHAT  ARE CAUSES OF STDs? An STD may be caused by bacteria, a virus, or parasites. STDs are often transmitted during sexual activity if one person is infected. However, they may also be  transmitted through nonsexual means. STDs may be transmitted after:   Sexual intercourse with an infected person.  Sharing sex toys with an infected person.  Sharing needles with an infected person or using unclean piercing or tattoo needles.  Having intimate contact with the genitals, mouth, or rectal areas of an infected person.  Exposure to infected fluids during birth. WHAT ARE THE SIGNS AND SYMPTOMS OF STDs? Different STDs have different symptoms. Some people may not have any symptoms. If symptoms are present, they may include:  Painful or bloody urination.  Pain in the pelvis, abdomen, vagina, anus, throat, or eyes.  A skin rash, itching, or irritation.  Growths, ulcerations, blisters, or sores in the genital and anal areas.  Abnormal vaginal discharge with or without bad odor.  Penile discharge in men.  Fever.  Pain or bleeding during sexual intercourse.  Swollen glands in the groin area.  Yellow skin and eyes (jaundice). This is seen with hepatitis.  Swollen testicles.  Infertility.  Sores and blisters in the mouth. HOW ARE STDs DIAGNOSED? To make a diagnosis, your health care provider may:  Take a medical history.  Perform a physical exam.  Take a sample of any discharge to examine.  Swab the throat, cervix, opening to the penis, rectum, or vagina for testing.  Test a sample of your first morning urine.  Perform blood tests.  Perform a Pap test, if this applies.  Perform a colposcopy.  Perform a laparoscopy. HOW ARE STDs TREATED? Treatment depends on the STD. Some STDs may be treated but not cured.  Chlamydia, gonorrhea, trichomonas, and syphilis can be cured with antibiotic medicine.  Genital herpes, hepatitis, and HIV can be treated, but not cured, with prescribed medicines. The medicines lessen symptoms.  Genital warts from HPV can be treated with medicine or by freezing, burning (electrocautery), or surgery. Warts may come back.  HPV  cannot be cured with medicine or surgery. However, abnormal areas may be removed from the cervix, vagina, or vulva.  If your diagnosis is confirmed, your recent sexual partners need treatment. This is true even if they are symptom-free or have a negative culture or evaluation. They should not have sex until their health care providers say it is okay.  Your health care provider may test you for infection again 3 months after treatment. HOW CAN I REDUCE MY RISK OF GETTING AN STD? Take these steps to reduce your risk of getting an STD:  Use latex condoms, dental dams, and water-soluble lubricants during sexual activity. Do not use petroleum jelly or oils.  Avoid having multiple sex partners.  Do not have sex with someone who has other sex partners  Do not have sex with anyone you do not know or who is at high risk for an STD.  Avoid risky sex practices that can break your skin.  Do not have sex if you have open sores on your mouth or skin.  Avoid drinking too much alcohol or taking illegal drugs. Alcohol and drugs can affect your judgment and put you in a vulnerable position.  Avoid engaging in oral and anal sex acts.  Get vaccinated for HPV and hepatitis. If you have not received these vaccines in the past, talk to your health care provider about whether one or both might be right for you.  If you are at risk of being infected with HIV, it is recommended that you take a prescription medicine daily to prevent HIV infection. This is called pre-exposure prophylaxis (PrEP). You are considered at risk if:  You are a man who has sex with other men (MSM).  You are a heterosexual man or woman and are sexually active with more than one partner.  You take drugs by injection.  You are sexually active with a partner who has HIV.  Talk with your health care provider about whether you are at high risk of being infected with HIV. If you choose to begin PrEP, you should first be tested for HIV. You  should then be tested every 3 months for as long as you are taking PrEP. WHAT SHOULD I DO IF I THINK I HAVE AN STD?  See your health care provider.  Tell your sexual partner(s). They should be tested and treated for any STDs.  Do not have sex until your health care provider says it is okay. WHEN SHOULD I GET IMMEDIATE MEDICAL CARE? Contact your health care provider right away if:   You have severe abdominal pain.  You are a man and notice swelling or pain in your testicles.  You are a woman and notice swelling or pain in your vagina.   This information is not intended to replace advice given to you by your health care provider. Make sure you discuss any questions you have with your health care provider.   Document Released: 02/15/2003 Document Revised: 12/16/2014 Document Reviewed: 06/15/2013 Elsevier Interactive Patient Education 2016 [employer]. Safe Sex Safe sex is about reducing the risk of giving or getting a sexually transmitted disease (STD). STDs are spread through sexual contact involving the genitals, mouth, or rectum. Some STDs can be cured and others cannot. Safe sex can also prevent unintended pregnancies.  WHAT ARE SOME SAFE SEX PRACTICES?  Limit your sexual activity to only one partner who is having sex with only you.  Talk to your partner about his or her past partners, past STDs, and drug use.  Use a condom every time you have sexual intercourse. This includes vaginal, oral, and anal sexual activity. Both females and males should wear condoms during oral sex. Only use latex or polyurethane condoms and water-based lubricants. Using petroleum-based lubricants or oils to lubricate a condom will weaken the condom and increase the chance that it will break. The condom should be in place from the beginning to the end of sexual activity. Wearing a condom reduces, but does not completely eliminate, your risk of getting or giving an STD. STDs can be spread by contact with  infected body fluids and skin.  Get vaccinated for hepatitis B and HPV.  Avoid alcohol and recreational drugs, which can affect your judgment. You may forget to use a condom or participate in high-risk sex.  For females, avoid douching after sexual intercourse. Douching can spread an infection farther into the reproductive tract.  Check your body for signs of sores, blisters, rashes, or unusual discharge. See your health care provider if you notice any of these signs.  Avoid sexual contact if you have symptoms of an infection or are being treated for an STD. If you or your partner has herpes, avoid sexual contact when blisters are present. Use condoms at all other times.  If you are at risk of being infected with HIV, it is recommended that you take a prescription medicine daily to prevent HIV infection. This is called  pre-exposure prophylaxis (PrEP). You are considered at risk if:  You are a man who has sex with other men (MSM).  You are a heterosexual man or woman who is sexually active with more than one partner.  You take drugs by injection.  You are sexually active with a partner who has HIV.  Talk with your health care provider about whether you are at high risk of being infected with HIV. If you choose to begin PrEP, you should first be tested for HIV. You should then be tested every 3 months for as long as you are taking PrEP.  See your health care provider for regular screenings, exams, and tests for other STDs. Before having sex with a new partner, each of you should be screened for STDs and should talk about the results with each other. WHAT ARE THE BENEFITS OF SAFE SEX?   There is less chance of getting or giving an STD.  You can prevent unwanted or unintended pregnancies.  By discussing safe sex concerns with your partner, you may increase feelings of intimacy, comfort, trust, and honesty between the two of you.   This information is not intended to replace advice given  to you by your health care provider. Make sure you discuss any questions you have with your health care provider.   Document Released: 01/02/2005 Document Revised: 12/16/2014 Document Reviewed: 05/18/2012 Elsevier Interactive Patient Education [employer]

## 2016-03-01 LAB — GC/CHLAMYDIA PROBE AMP (~~LOC~~) NOT AT ARMC
Chlamydia: POSITIVE — AB
Neisseria Gonorrhea: NEGATIVE

## 2016-03-04 ENCOUNTER — Telehealth (HOSPITAL_BASED_OUTPATIENT_CLINIC_OR_DEPARTMENT_OTHER): Payer: Self-pay | Admitting: Emergency Medicine

## 2016-07-05 ENCOUNTER — Emergency Department (HOSPITAL_COMMUNITY)
Admission: EM | Admit: 2016-07-05 | Discharge: 2016-07-05 | Disposition: A | Discharge status: Home / Self Care | Payer: Medicaid Other | Attending: Emergency Medicine | Admitting: Emergency Medicine

## 2016-07-05 ENCOUNTER — Encounter (HOSPITAL_COMMUNITY): Payer: Self-pay | Admitting: *Deleted

## 2016-07-05 DIAGNOSIS — J45909 Unspecified asthma, uncomplicated: Secondary | ICD-10-CM | POA: Insufficient documentation

## 2016-07-05 DIAGNOSIS — Z202 Contact with and (suspected) exposure to infections with a predominantly sexual mode of transmission: Secondary | ICD-10-CM

## 2016-07-05 MED ORDER — METRONIDAZOLE 500 MG PO TABS
2000.0000 mg | ORAL_TABLET | Freq: Once | ORAL | Status: AC
  Administered 2016-07-05: 2000 mg via ORAL
  Filled 2016-07-05: qty 4

## 2016-07-05 MED ORDER — AZITHROMYCIN 250 MG PO TABS
1000.0000 mg | ORAL_TABLET | Freq: Once | ORAL | Status: AC
Start: 2016-07-05 — End: 2016-07-05
  Administered 2016-07-05: 1000 mg via ORAL
  Filled 2016-07-05: qty 4

## 2016-07-05 MED ORDER — LIDOCAINE HCL (PF) 1 % IJ SOLN
5.0000 mL | Freq: Once | INTRAMUSCULAR | Status: AC
  Administered 2016-07-05: 5 mL
  Filled 2016-07-05: qty 5

## 2016-07-05 MED ORDER — CEFTRIAXONE SODIUM 250 MG IJ SOLR
250.0000 mg | Freq: Once | INTRAMUSCULAR | Status: AC
  Administered 2016-07-05: 250 mg via INTRAMUSCULAR
  Filled 2016-07-05: qty 250

## 2016-07-05 NOTE — ED Triage Notes (Signed)
Pt reports that his partner tested + for chlyamidia and he  Needs treatment. Denies symptoms.

## 2016-07-05 NOTE — ED Provider Notes (Signed)
[facility]-EMERGENCY DEPT Provider Note   CSN: [id number] Arrival date & time: 07/05/16  1039  First Provider Contact:  11:39 AM   By signing my name below, I, [name], attest that this documentation has been prepared under the direction and in the presence of Charlestine Night, PA-C Electronically Signed: Charline Bills, ED Scribe 07/05/2016 at 11:56 AM.   History   Chief Complaint Chief Complaint  Patient presents with  . Exposure to STD    HPI Aaron Dougherty is a 20 y.o. male who presents to the Emergency Department complaining of exposure to STD. Pt states that his male partner was recently tested positive for chlamydia. He denies any symptoms at this time. Pt requests treatment for chlamydia at this visit.   The history is provided by the patient. No language interpreter was used.    Past Medical History:  Diagnosis Date  . Asthma     There are no active problems to display for this patient.   History reviewed. No pertinent surgical history.    Home Medications    Prior to Admission medications   Medication Sig Start Date End Date Taking? Authorizing Provider  albuterol (PROVENTIL HFA;VENTOLIN HFA) 108 (90 BASE) MCG/ACT inhaler Inhale 2 puffs into the lungs every 6 (six) hours as needed. Wheezing or shortness of breath    Historical Provider, MD  cephALEXin (KEFLEX) 500 MG capsule Take 1 capsule (500 mg total) by mouth 4 (four) times daily. 10/16/15   Hanna Patel-Mills, PA-C  clindamycin (CLEOCIN) 300 MG capsule Take 2 capsules (600 mg total) by mouth 3 (three) times daily. 09/02/12   [name], DO  ibuprofen (ADVIL,MOTRIN) 800 MG tablet Take 1 tablet (800 mg total) by mouth every 8 (eight) hours as needed for mild pain or moderate pain. 12/07/14   Trixie Dredge, PA-C    Family History History reviewed. No pertinent family history.  Social History Social History  Substance Use Topics  . Smoking status: Never Smoker  . Smokeless tobacco: Not on file  . Alcohol  use No     Allergies   Review of patient's allergies indicates no known allergies.   Review of Systems Review of Systems A complete 10 system review of systems was obtained and all systems are negative except as noted in the HPI and PMH.    Physical Exam Updated Vital Signs BP 120/76 (BP Location: Left Arm)   Pulse (!) 58   Temp 97.8 F (36.6 C) (Oral)   Resp 14   SpO2 100%   Physical Exam  Constitutional: He is oriented to person, place, and time. He appears well-developed and well-nourished. No distress.  HENT:  Head: Normocephalic and atraumatic.  Eyes: Conjunctivae and EOM are normal.  Neck: Neck supple. No tracheal deviation present.  Cardiovascular: Normal rate.   Pulmonary/Chest: Effort normal. No respiratory distress.  Musculoskeletal: Normal range of motion.  Neurological: He is alert and oriented to person, place, and time.  Skin: Skin is warm and dry.  Psychiatric: He has a normal mood and affect. His behavior is normal.  Nursing note and vitals reviewed.    ED Treatments / Results  Labs (all labs ordered are listed, but only abnormal results are displayed) Labs Reviewed - No data to display  EKG  EKG Interpretation None       Radiology No results found.  Procedures Procedures (including critical care time) DIAGNOSTIC STUDIES: Oxygen Saturation is 100% on RA, normal by my interpretation.    COORDINATION OF CARE: 11:39  AM-Discussed treatment plan which includes Rocephin, Zithromax, Flagyl and lidocaine with pt at bedside and pt agreed to plan.   Medications Ordered in ED Medications  cefTRIAXone (ROCEPHIN) injection 250 mg (not administered)  azithromycin (ZITHROMAX) tablet 1,000 mg (not administered)  metroNIDAZOLE (FLAGYL) tablet 2,000 mg (not administered)  lidocaine (PF) (XYLOCAINE) 1 % injection 5 mL (not administered)    Initial Impression / Assessment and Plan / ED Course  I have reviewed the triage vital signs and the nursing  notes.  Pertinent labs & imaging results that were available during my care of the patient were reviewed by me and considered in my medical decision making (see chart for details).  Clinical Course   Patient be treated for STD exposure.  Told to return here as needed.  Patient agrees the plan and all questions were answered  Final Clinical Impressions(s) / ED Diagnoses   Final diagnoses:  None    New Prescriptions New Prescriptions   No medications on file     Charlestine Night, PA-C 07/05/16 1257    Shaune Pollack, MD 07/05/16 2038

## 2016-07-05 NOTE — Discharge Instructions (Signed)
Return here as needed.  Follow up with her primary care doctor °

## 2016-12-20 ENCOUNTER — Encounter (HOSPITAL_COMMUNITY): Payer: Self-pay | Admitting: Emergency Medicine

## 2016-12-20 ENCOUNTER — Emergency Department (HOSPITAL_COMMUNITY)
Admission: EM | Admit: 2016-12-20 | Discharge: 2016-12-20 | Disposition: A | Discharge status: Home / Self Care | Payer: Medicaid Other | Attending: Dermatology | Admitting: Dermatology

## 2016-12-20 DIAGNOSIS — Z202 Contact with and (suspected) exposure to infections with a predominantly sexual mode of transmission: Secondary | ICD-10-CM | POA: Insufficient documentation

## 2016-12-20 DIAGNOSIS — Z5321 Procedure and treatment not carried out due to patient leaving prior to being seen by health care provider: Secondary | ICD-10-CM | POA: Insufficient documentation

## 2016-12-20 NOTE — ED Notes (Signed)
Pt states he is tired of waiting and he's going to health dept.

## 2016-12-20 NOTE — ED Triage Notes (Signed)
Pt presents to ED for assessment after finding out a previous partner has been diagnosed with Herpes.  Pt wanting to be tested "for everything".  Pt denies any symptoms, and states exposure was not recent.

## 2018-03-01 ENCOUNTER — Emergency Department (HOSPITAL_COMMUNITY): Payer: Self-pay

## 2018-03-01 ENCOUNTER — Emergency Department (HOSPITAL_COMMUNITY)
Admission: EM | Admit: 2018-03-01 | Discharge: 2018-03-01 | Disposition: A | Discharge status: Home / Self Care | Payer: Self-pay | Attending: Emergency Medicine | Admitting: Emergency Medicine

## 2018-03-01 ENCOUNTER — Encounter (HOSPITAL_COMMUNITY): Payer: Self-pay | Admitting: Radiology

## 2018-03-01 DIAGNOSIS — Y9289 Other specified places as the place of occurrence of the external cause: Secondary | ICD-10-CM | POA: Insufficient documentation

## 2018-03-01 DIAGNOSIS — S71131A Puncture wound without foreign body, right thigh, initial encounter: Secondary | ICD-10-CM

## 2018-03-01 DIAGNOSIS — S71102A Unspecified open wound, left thigh, initial encounter: Secondary | ICD-10-CM | POA: Insufficient documentation

## 2018-03-01 DIAGNOSIS — S71132A Puncture wound without foreign body, left thigh, initial encounter: Secondary | ICD-10-CM

## 2018-03-01 DIAGNOSIS — S71101A Unspecified open wound, right thigh, initial encounter: Secondary | ICD-10-CM | POA: Insufficient documentation

## 2018-03-01 DIAGNOSIS — W3400XA Accidental discharge from unspecified firearms or gun, initial encounter: Secondary | ICD-10-CM

## 2018-03-01 DIAGNOSIS — Y9389 Activity, other specified: Secondary | ICD-10-CM | POA: Insufficient documentation

## 2018-03-01 DIAGNOSIS — Y999 Unspecified external cause status: Secondary | ICD-10-CM | POA: Insufficient documentation

## 2018-03-01 LAB — URINALYSIS, ROUTINE W REFLEX MICROSCOPIC
Bilirubin Urine: NEGATIVE
Glucose, UA: NEGATIVE mg/dL
Hgb urine dipstick: NEGATIVE
KETONES UR: NEGATIVE mg/dL
LEUKOCYTES UA: NEGATIVE
NITRITE: NEGATIVE
PH: 6 (ref 5.0–8.0)
PROTEIN: NEGATIVE mg/dL
Specific Gravity, Urine: 1.043 — ABNORMAL HIGH (ref 1.005–1.030)

## 2018-03-01 LAB — COMPREHENSIVE METABOLIC PANEL
ALT: 16 U/L — AB (ref 17–63)
ANION GAP: 12 (ref 5–15)
AST: 33 U/L (ref 15–41)
Albumin: 3.7 g/dL (ref 3.5–5.0)
Alkaline Phosphatase: 49 U/L (ref 38–126)
BUN: 9 mg/dL (ref 6–20)
CHLORIDE: 106 mmol/L (ref 101–111)
CO2: 24 mmol/L (ref 22–32)
Calcium: 9.2 mg/dL (ref 8.9–10.3)
Creatinine, Ser: 1.44 mg/dL — ABNORMAL HIGH (ref 0.61–1.24)
Glucose, Bld: 115 mg/dL — ABNORMAL HIGH (ref 65–99)
POTASSIUM: 4.1 mmol/L (ref 3.5–5.1)
Sodium: 142 mmol/L (ref 135–145)
Total Bilirubin: 0.7 mg/dL (ref 0.3–1.2)
Total Protein: 6.4 g/dL — ABNORMAL LOW (ref 6.5–8.1)

## 2018-03-01 LAB — I-STAT CG4 LACTIC ACID, ED
Lactic Acid, Venous: 5.27 mmol/L (ref 0.5–1.9)
Lactic Acid, Venous: 1.67 mmol/L (ref 0.5–1.9)
Lactic Acid, Venous: 1.74 mmol/L (ref 0.5–1.9)

## 2018-03-01 LAB — I-STAT CHEM 8, ED
BUN: 11 mg/dL (ref 6–20)
CALCIUM ION: 1.09 mmol/L — AB (ref 1.15–1.40)
CREATININE: 1.3 mg/dL — AB (ref 0.61–1.24)
Chloride: 107 mmol/L (ref 101–111)
GLUCOSE: 115 mg/dL — AB (ref 65–99)
HCT: 41 % (ref 39.0–52.0)
HEMOGLOBIN: 13.9 g/dL (ref 13.0–17.0)
Potassium: 3.4 mmol/L — ABNORMAL LOW (ref 3.5–5.1)
Sodium: 142 mmol/L (ref 135–145)
TCO2: 21 mmol/L — AB (ref 22–32)

## 2018-03-01 LAB — CBC
HCT: 40 % (ref 39.0–52.0)
HEMOGLOBIN: 12.7 g/dL — AB (ref 13.0–17.0)
MCH: 22 pg — ABNORMAL LOW (ref 26.0–34.0)
MCHC: 31.8 g/dL (ref 30.0–36.0)
MCV: 69.2 fL — AB (ref 78.0–100.0)
PLATELETS: 229 10*3/uL (ref 150–400)
RBC: 5.78 MIL/uL (ref 4.22–5.81)
RDW: 14.8 % (ref 11.5–15.5)
WBC: 9.2 10*3/uL (ref 4.0–10.5)

## 2018-03-01 LAB — PROTIME-INR
INR: 1.06
Prothrombin Time: 13.7 seconds (ref 11.4–15.2)

## 2018-03-01 LAB — ETHANOL: Alcohol, Ethyl (B): <10 mg/dL (ref <10)

## 2018-03-01 LAB — SAMPLE TO BLOOD BANK

## 2018-03-01 MED ORDER — MORPHINE SULFATE 2 MG/ML IJ SOLN
INTRAMUSCULAR | Status: AC | PRN
  Administered 2018-03-01: 4 mg via INTRAVENOUS

## 2018-03-01 MED ORDER — OXYCODONE-ACETAMINOPHEN 5-325 MG PO TABS
2.0000 | ORAL_TABLET | Freq: Once | ORAL | Status: AC
  Administered 2018-03-01: 2 via ORAL
  Filled 2018-03-01: qty 2

## 2018-03-01 MED ORDER — MORPHINE SULFATE (PF) 4 MG/ML IV SOLN
INTRAVENOUS | Status: AC
  Filled 2018-03-01: qty 1

## 2018-03-01 MED ORDER — SODIUM CHLORIDE 0.9 % IV SOLN
INTRAVENOUS | Status: AC | PRN
  Administered 2018-03-01: 1000 mL via INTRAVENOUS

## 2018-03-01 MED ORDER — IOPAMIDOL (ISOVUE-370) INJECTION 76%
INTRAVENOUS | Status: AC
  Administered 2018-03-01: 100 mL
  Filled 2018-03-01: qty 100

## 2018-03-01 MED ORDER — OXYCODONE-ACETAMINOPHEN 5-325 MG PO TABS: 2.0000 | ORAL_TABLET | ORAL | 0 refills | Status: AC | PRN

## 2018-03-01 NOTE — ED Notes (Signed)
Asked pt for urine specimen. Pt unable to urinate @ this time. 

## 2018-03-01 NOTE — ED Notes (Signed)
Paged trauma MD Per Dr.Butler

## 2018-03-01 NOTE — ED Notes (Signed)
Warm blankets placed on pt. 
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
## 2018-03-01 NOTE — ED Notes (Signed)
Pt is unable to urinate and shows concern.

## 2018-03-01 NOTE — ED Notes (Signed)
Pt verbalized understanding discharge instructions and denies any further needs or questions at this time. VS stable 
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
## 2018-03-01 NOTE — ED Notes (Signed)
Dr Fredderick PhenixBelfi notified of bladder scan results.
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
## 2018-03-01 NOTE — ED Provider Notes (Signed)
Patient is now changed his mind about staying.  He feels that he can get around at home okay with a walker.  He was discharged with instructions and pain medications per Dr. Charm BargesButler.  Dr. Luisa Hartornett has spoke with the patient and patient will plan on following up in the trauma clinic.   Rolan BuccoBelfi, Darrelle Wiberg, MD 03/01/18 [phone]
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
## 2018-03-01 NOTE — ED Notes (Signed)
Pt unable to ambulate without assistive device. Pt stated pain was @ 9 on scale of 0-10 with use of walker and 2 standby. Pt shows difficulty ambulating and unable to put pressure on limb. Pt L leg is also swollen.

## 2018-03-02 ENCOUNTER — Encounter (HOSPITAL_COMMUNITY): Payer: Self-pay | Admitting: Emergency Medicine
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
## 2019-07-06 ENCOUNTER — Ambulatory Visit: Payer: Self-pay

## 2019-07-12 ENCOUNTER — Ambulatory Visit: Payer: Self-pay
